# Patient Record
Sex: Female | Born: 1965 | Race: White | Hispanic: No | Marital: Married | Smoking: Never smoker
Health system: Southern US, Community
[De-identification: ages and names within clinical notes are randomized; demographics above are authoritative.]

## PROBLEM LIST (undated history)

## (undated) DIAGNOSIS — D649 Anemia, unspecified: Secondary | ICD-10-CM

## (undated) DIAGNOSIS — R87619 Unspecified abnormal cytological findings in specimens from cervix uteri: Secondary | ICD-10-CM

---

## 2005-05-16 ENCOUNTER — Ambulatory Visit: Payer: Self-pay | Admitting: Gastroenterology

## 2006-06-20 ENCOUNTER — Ambulatory Visit: Payer: Self-pay | Admitting: Obstetrics and Gynecology

## 2008-09-02 ENCOUNTER — Ambulatory Visit: Payer: Self-pay | Admitting: General Practice

## 2009-05-14 ENCOUNTER — Ambulatory Visit: Payer: Self-pay | Admitting: Obstetrics and Gynecology

## 2011-11-29 ENCOUNTER — Ambulatory Visit: Payer: Self-pay | Admitting: Obstetrics and Gynecology

## 2012-12-11 ENCOUNTER — Ambulatory Visit: Payer: Self-pay | Admitting: Obstetrics and Gynecology

## 2015-07-30 ENCOUNTER — Other Ambulatory Visit: Payer: Self-pay | Admitting: Obstetrics and Gynecology

## 2015-07-30 DIAGNOSIS — Z1231 Encounter for screening mammogram for malignant neoplasm of breast: Secondary | ICD-10-CM

## 2015-08-18 ENCOUNTER — Ambulatory Visit: Payer: Self-pay

## 2015-08-19 ENCOUNTER — Ambulatory Visit
Admission: RE | Admit: 2015-08-19 | Discharge: 2015-08-19 | Disposition: A | Discharge status: Home / Self Care | Payer: 59 | Source: Ambulatory Visit | Attending: Obstetrics and Gynecology | Admitting: Obstetrics and Gynecology

## 2015-08-19 ENCOUNTER — Other Ambulatory Visit: Payer: Self-pay | Admitting: Obstetrics and Gynecology

## 2015-08-19 DIAGNOSIS — Z1231 Encounter for screening mammogram for malignant neoplasm of breast: Secondary | ICD-10-CM

## 2015-08-19 DIAGNOSIS — R928 Other abnormal and inconclusive findings on diagnostic imaging of breast: Secondary | ICD-10-CM | POA: Diagnosis not present

## 2015-08-21 ENCOUNTER — Other Ambulatory Visit: Payer: Self-pay | Admitting: Obstetrics and Gynecology

## 2015-08-21 DIAGNOSIS — N6489 Other specified disorders of breast: Secondary | ICD-10-CM

## 2015-08-27 ENCOUNTER — Ambulatory Visit
Admission: RE | Admit: 2015-08-27 | Discharge: 2015-08-27 | Disposition: A | Discharge status: Home / Self Care | Payer: 59 | Source: Ambulatory Visit | Attending: Obstetrics and Gynecology | Admitting: Obstetrics and Gynecology

## 2015-08-27 DIAGNOSIS — N6489 Other specified disorders of breast: Secondary | ICD-10-CM

## 2015-08-27 DIAGNOSIS — N6001 Solitary cyst of right breast: Secondary | ICD-10-CM | POA: Diagnosis not present

## 2016-07-19 DIAGNOSIS — N921 Excessive and frequent menstruation with irregular cycle: Secondary | ICD-10-CM | POA: Diagnosis not present

## 2016-08-02 DIAGNOSIS — J020 Streptococcal pharyngitis: Secondary | ICD-10-CM | POA: Diagnosis not present

## 2016-08-02 DIAGNOSIS — J029 Acute pharyngitis, unspecified: Secondary | ICD-10-CM | POA: Diagnosis not present

## 2016-08-09 DIAGNOSIS — N920 Excessive and frequent menstruation with regular cycle: Secondary | ICD-10-CM | POA: Diagnosis not present

## 2016-08-09 DIAGNOSIS — R938 Abnormal findings on diagnostic imaging of other specified body structures: Secondary | ICD-10-CM | POA: Diagnosis not present

## 2016-08-09 DIAGNOSIS — N921 Excessive and frequent menstruation with irregular cycle: Secondary | ICD-10-CM | POA: Diagnosis not present

## 2016-08-25 DIAGNOSIS — Z01419 Encounter for gynecological examination (general) (routine) without abnormal findings: Secondary | ICD-10-CM | POA: Diagnosis not present

## 2016-08-25 DIAGNOSIS — N920 Excessive and frequent menstruation with regular cycle: Secondary | ICD-10-CM | POA: Diagnosis not present

## 2016-08-25 DIAGNOSIS — Z1231 Encounter for screening mammogram for malignant neoplasm of breast: Secondary | ICD-10-CM | POA: Diagnosis not present

## 2016-09-06 ENCOUNTER — Encounter
Admission: RE | Admit: 2016-09-06 | Discharge: 2016-09-06 | Disposition: A | Discharge status: Home / Self Care | Payer: 59 | Source: Ambulatory Visit | Attending: Obstetrics and Gynecology | Admitting: Obstetrics and Gynecology

## 2016-09-06 HISTORY — DX: Anemia, unspecified: D64.9

## 2016-09-06 HISTORY — DX: Unspecified abnormal cytological findings in specimens from cervix uteri: R87.619

## 2016-09-06 NOTE — H&P (Signed)
Patient ID: Laurie Reynolds is a 51 y.o. female presenting with Annual Exam ([place])  on 08/25/2016  HPI: AUB with a SIS showing calcifide fibroid submucosal and polyp.  _EMBx 08/09/16:  ENDOMETRIUM, BIOPSY:  ATROPHIC ENDOMETRIAL GLANDS AND DECIDUALIZED STROMA COMPATIBLE  WITH PROGESTIN EFFECT. NO HYPERPLASIA OR CARCINOMA.   Pap test:  6/17: neg with neg HPV  Past Medical History:  has a past medical history of Abnormal uterine bleeding, unspecified.  Past Surgical History:  has a past surgical history that includes Cesarean section. Family History: family history includes Thyroid disease in her mother. Social History:  reports that she has never smoked. She has never used smokeless tobacco. She reports that she drinks alcohol. She reports that she does not use drugs. OB/GYN History:          OB History    Gravida Para Term Preterm AB Living   3 3 3     2    SAB TAB Ectopic Molar Multiple Live Births                    Allergies: has No Known Allergies. Medications:  Current Outpatient Prescriptions:  .  metroNIDAZOLE (FLAGYL) 500 MG tablet, Take 1 tablet (500 mg total) by mouth 2 (two) times daily. (Patient not taking: Reported on 08/25/2016 ), Disp: 14 tablet, Rfl: 0 .  norgestimate-ethinyl estradiol (ORTHO-CYCLEN,SPINTEC,PREVIFEM) 0.25-35 mg-mcg tablet, Take 1 tablet by mouth once daily. (Patient not taking: Reported on 08/25/2016 ), Disp: 1 Package, Rfl: 0  Review of Systems: No SOB, no palpitations or chest pain, no new lower extremity edema, no nausea or vomiting or bowel or bladder complaints. See HPI for gyn specific ROS.   Exam:   BP 134/86   Pulse 70   Ht 172.7 cm (5\' 8" )   Wt 69.4 kg (153 lb)   LMP 08/10/2016 (Exact Date)   BMI 23.26 kg/m   General: Patient is well-groomed, well-nourished, appears stated age in no acute distress  HEENT: head is atraumatic and normocephalic, trachea is midline, neck is supple with no  palpable nodules  CV: Regular rhythm and normal heart rate, no murmur  Pulm: Clear to auscultation throughout lung fields with no wheezing, crackles, or rhonchi. No increased work of breathing  Abdomen: soft , no mass, non-tender, no rebound tenderness, no hepatomegaly  Pelvic: tanner stage 5 ,              External genitalia: vulva /labia no lesions             Urethra: no prolapse             Vagina: normal physiologic d/c, laxity in vaginal walls             Cervix: no lesions, no cervical motion tenderness, good descent             Uterus: normal size shape and contour, non-tender             Adnexa: no mass,  non-tender               Rectovaginal: External wnl       Impression:   The primary encounter diagnosis was Encounter for gynecological examination without abnormal finding. Diagnoses of Breast cancer screening and Excessive or frequent menstruation were also pertinent to this visit.    Plan:   Patient returns for a preoperative discussion regarding her plans to proceed with definitive surgical treatment of her AUB-polyp and fibroid by  total vaginal hysterectomy with bilateral salpingectomy. We discussed D&C with myomectomy in detail as well  The patient and I discussed the technical aspects of the procedure including the potential for risks and complications. These include but are not limited to the risk of infection requiring post-operative antibiotics or further procedures. We talked about the risk of injury to adjacent organs including bladder, bowel, ureter, blood vessels or nerves. We talked about the need to convert to a laparoscopy or an open incision. We talked about the possible need for blood transfusion. We talked aboutpostop complications such asthromboembolic or cardiopulmonary complications. All of her questions were answered.  Her preoperative exam was completed and the appropriate consents were signed. She is scheduled to undergo this  procedure in the near future.

## 2016-09-06 NOTE — Patient Instructions (Signed)
  Your procedure is scheduled on: 09-26-16 MONDAY Report to Same Day Surgery 2nd floor medical mall [facility] Entrance-take elevator on left to 2nd floor.  Check in with surgery information desk.) To find out your arrival time please call [phone] between 1PM - 3PM on 09-23-16 FRIDAY  Remember: Instructions that are not followed completely may result in serious medical risk, up to and including death, or upon the discretion of your surgeon and anesthesiologist your surgery may need to be rescheduled.    _x___ 1. Do not eat food or drink liquids after midnight. No gum chewing or hard candies.     __x__ 2. No Alcohol for 24 hours before or after surgery.   __x__3. No Smoking for 24 prior to surgery.   ____  4. Bring all medications with you on the day of surgery if instructed.    __x__ 5. Notify your doctor if there is any change in your medical condition     (cold, fever, infections).     Do not wear jewelry, make-up, hairpins, clips or nail polish.  Do not wear lotions, powders, or perfumes. You may wear deodorant.  Do not shave 48 hours prior to surgery. Men may shave face and neck.  Do not bring valuables to the hospital.    [facility] is not responsible for any belongings or valuables.               Contacts, dentures or bridgework may not be worn into surgery.  Leave your suitcase in the car. After surgery it may be brought to your room.  For patients admitted to the hospital, discharge time is determined by your treatment team.   Patients discharged the day of surgery will not be allowed to drive home.  You will need someone to drive you home and stay with you the night of your procedure.    Please read over the following fact sheets that you were given:      ____ Take anti-hypertensive (unless it includes a diuretic), cardiac, seizure, asthma,     anti-reflux and psychiatric medicines. These include:  1. NONE  2.  3.  4.  5.  6.  ____Fleets enema or Magnesium  Citrate as directed.   ____ Use CHG Soap or sage wipes as directed on instruction sheet   ____ Use inhalers on the day of surgery and bring to hospital day of surgery  ____ Stop Metformin and Janumet 2 days prior to surgery.    ____ Take 1/2 of usual insulin dose the night before surgery and none on the morning surgery.   ____ Follow recommendations from Cardiologist, Pulmonologist or PCP regarding stopping Aspirin, Coumadin, Pllavix ,Eliquis, Effient, or Pradaxa, and Pletal.  X____Stop Anti-inflammatories such as Advil, Aleve, IBUPROFEN, Motrin, Naproxen, Naprosyn, Goodies powders or aspirin products 7 DAYS PRIOR TO SURGERY-OK to take Tylenol    _x___ Stop supplements until after surgery-STOP HERBAL LIFE VITAMINS 7 DAYS PRIOR TO SURGERY-MAY RESUME AFTER SURGERY   ____ Bring C-Pap to the hospital.

## 2016-09-19 ENCOUNTER — Encounter
Admission: RE | Admit: 2016-09-19 | Discharge: 2016-09-19 | Disposition: A | Discharge status: Home / Self Care | Payer: 59 | Source: Ambulatory Visit | Attending: Obstetrics and Gynecology | Admitting: Obstetrics and Gynecology

## 2016-09-19 DIAGNOSIS — Z9889 Other specified postprocedural states: Secondary | ICD-10-CM | POA: Insufficient documentation

## 2016-09-19 DIAGNOSIS — N920 Excessive and frequent menstruation with regular cycle: Secondary | ICD-10-CM | POA: Diagnosis not present

## 2016-09-19 DIAGNOSIS — Z01812 Encounter for preprocedural laboratory examination: Secondary | ICD-10-CM | POA: Insufficient documentation

## 2016-09-19 DIAGNOSIS — D259 Leiomyoma of uterus, unspecified: Secondary | ICD-10-CM | POA: Diagnosis not present

## 2016-09-19 DIAGNOSIS — Z79899 Other long term (current) drug therapy: Secondary | ICD-10-CM | POA: Insufficient documentation

## 2016-09-19 DIAGNOSIS — Z01810 Encounter for preprocedural cardiovascular examination: Secondary | ICD-10-CM | POA: Insufficient documentation

## 2016-09-19 DIAGNOSIS — Z8349 Family history of other endocrine, nutritional and metabolic diseases: Secondary | ICD-10-CM | POA: Insufficient documentation

## 2016-09-19 LAB — CBC
HCT: 34 % — ABNORMAL LOW (ref 35.0–47.0)
Hemoglobin: 11.1 g/dL — ABNORMAL LOW (ref 12.0–16.0)
MCH: 27.5 pg (ref 26.0–34.0)
MCHC: 32.7 g/dL (ref 32.0–36.0)
MCV: 84 fL (ref 80.0–100.0)
PLATELETS: 385 10*3/uL (ref 150–440)
RBC: 4.05 MIL/uL (ref 3.80–5.20)
RDW: 18.2 % — AB (ref 11.5–14.5)
WBC: 6.9 10*3/uL (ref 3.6–11.0)

## 2016-09-19 LAB — BASIC METABOLIC PANEL
Anion gap: 6 (ref 5–15)
BUN: 9 mg/dL (ref 6–20)
CALCIUM: 9.1 mg/dL (ref 8.9–10.3)
CO2: 27 mmol/L (ref 22–32)
CREATININE: 0.78 mg/dL (ref 0.44–1.00)
Chloride: 105 mmol/L (ref 101–111)
GFR calc non Af Amer: >60 mL/min (ref >60)
Glucose, Bld: 88 mg/dL (ref 65–99)
Potassium: 3.9 mmol/L (ref 3.5–5.1)
Sodium: 138 mmol/L (ref 135–145)

## 2016-09-19 LAB — TYPE AND SCREEN
ABO/RH(D): O NEG
Antibody Screen: NEGATIVE

## 2016-09-25 MED ORDER — CEFAZOLIN SODIUM-DEXTROSE 2-4 GM/100ML-% IV SOLN
2.0000 g | INTRAVENOUS | Status: AC
  Administered 2016-09-26: 2 g via INTRAVENOUS

## 2016-09-26 ENCOUNTER — Ambulatory Visit: Payer: 59 | Admitting: Anesthesiology

## 2016-09-26 ENCOUNTER — Ambulatory Visit
Admission: RE | Admit: 2016-09-26 | Discharge: 2016-09-26 | Disposition: A | Discharge status: Home / Self Care | Payer: 59 | Source: Ambulatory Visit | Attending: Obstetrics and Gynecology | Admitting: Obstetrics and Gynecology

## 2016-09-26 ENCOUNTER — Encounter
Admission: RE | Disposition: A | Discharge status: Home / Self Care | Payer: Self-pay | Source: Ambulatory Visit | Attending: Obstetrics and Gynecology

## 2016-09-26 DIAGNOSIS — D250 Submucous leiomyoma of uterus: Secondary | ICD-10-CM | POA: Diagnosis present

## 2016-09-26 DIAGNOSIS — N888 Other specified noninflammatory disorders of cervix uteri: Secondary | ICD-10-CM | POA: Diagnosis not present

## 2016-09-26 DIAGNOSIS — N920 Excessive and frequent menstruation with regular cycle: Secondary | ICD-10-CM | POA: Diagnosis not present

## 2016-09-26 DIAGNOSIS — N838 Other noninflammatory disorders of ovary, fallopian tube and broad ligament: Secondary | ICD-10-CM | POA: Diagnosis not present

## 2016-09-26 DIAGNOSIS — Z79899 Other long term (current) drug therapy: Secondary | ICD-10-CM | POA: Diagnosis not present

## 2016-09-26 DIAGNOSIS — N921 Excessive and frequent menstruation with irregular cycle: Secondary | ICD-10-CM | POA: Diagnosis not present

## 2016-09-26 HISTORY — PX: VAGINAL HYSTERECTOMY: SHX2639

## 2016-09-26 HISTORY — PX: BILATERAL SALPINGECTOMY: SHX5743

## 2016-09-26 LAB — POCT PREGNANCY, URINE: Preg Test, Ur: NEGATIVE

## 2016-09-26 LAB — ABO/RH: ABO/RH(D): O NEG

## 2016-09-26 SURGERY — HYSTERECTOMY, VAGINAL
Anesthesia: General | Wound class: Clean Contaminated

## 2016-09-26 MED ORDER — ACETAMINOPHEN 10 MG/ML IV SOLN
INTRAVENOUS | Status: DC | PRN
  Administered 2016-09-26: 1000 mg via INTRAVENOUS

## 2016-09-26 MED ORDER — ACETAMINOPHEN 500 MG PO TABS: 1000.0000 mg | ORAL_TABLET | Freq: Four times a day (QID) | ORAL | 0 refills | Status: AC

## 2016-09-26 MED ORDER — ROCURONIUM BROMIDE 100 MG/10ML IV SOLN
INTRAVENOUS | Status: DC | PRN
  Administered 2016-09-26: 20 mg via INTRAVENOUS
  Administered 2016-09-26: 10 mg via INTRAVENOUS
  Administered 2016-09-26: 30 mg via INTRAVENOUS

## 2016-09-26 MED ORDER — FAMOTIDINE 20 MG PO TABS
ORAL_TABLET | ORAL | Status: AC
  Filled 2016-09-26: qty 1

## 2016-09-26 MED ORDER — LIDOCAINE-EPINEPHRINE 1 %-1:100000 IJ SOLN
INTRAMUSCULAR | Status: DC | PRN
  Administered 2016-09-26: 20 mL

## 2016-09-26 MED ORDER — SUGAMMADEX SODIUM 200 MG/2ML IV SOLN
INTRAVENOUS | Status: DC | PRN
  Administered 2016-09-26: 140 mg via INTRAVENOUS

## 2016-09-26 MED ORDER — FAMOTIDINE 20 MG PO TABS
20.0000 mg | ORAL_TABLET | Freq: Once | ORAL | Status: AC
  Administered 2016-09-26: 20 mg via ORAL

## 2016-09-26 MED ORDER — OXYCODONE HCL 5 MG PO CAPS: 5.0000 mg | ORAL_CAPSULE | Freq: Four times a day (QID) | ORAL | 0 refills | Status: AC | PRN

## 2016-09-26 MED ORDER — OXYCODONE HCL 5 MG PO TABS
ORAL_TABLET | ORAL | Status: AC
  Administered 2016-09-26: 5 mg via ORAL
  Filled 2016-09-26: qty 1

## 2016-09-26 MED ORDER — LIDOCAINE HCL (CARDIAC) 20 MG/ML IV SOLN
INTRAVENOUS | Status: DC | PRN
  Administered 2016-09-26: 50 mg via INTRAVENOUS

## 2016-09-26 MED ORDER — ACETAMINOPHEN 10 MG/ML IV SOLN
INTRAVENOUS | Status: AC
Start: 2016-09-26 — End: ?
  Filled 2016-09-26: qty 100

## 2016-09-26 MED ORDER — LACTATED RINGERS IV SOLN: INTRAVENOUS | Status: DC

## 2016-09-26 MED ORDER — LACTATED RINGERS IV SOLN
INTRAVENOUS | Status: DC
  Administered 2016-09-26: 12:00:00 via INTRAVENOUS

## 2016-09-26 MED ORDER — SUGAMMADEX SODIUM 500 MG/5ML IV SOLN
INTRAVENOUS | Status: AC
  Filled 2016-09-26: qty 5

## 2016-09-26 MED ORDER — FENTANYL CITRATE (PF) 100 MCG/2ML IJ SOLN
INTRAMUSCULAR | Status: DC | PRN
  Administered 2016-09-26: 50 ug via INTRAVENOUS
  Administered 2016-09-26: 150 ug via INTRAVENOUS

## 2016-09-26 MED ORDER — CEFAZOLIN SODIUM-DEXTROSE 2-4 GM/100ML-% IV SOLN
INTRAVENOUS | Status: AC
  Filled 2016-09-26: qty 100

## 2016-09-26 MED ORDER — MIDAZOLAM HCL 2 MG/2ML IJ SOLN
INTRAMUSCULAR | Status: AC
  Filled 2016-09-26: qty 2

## 2016-09-26 MED ORDER — IBUPROFEN 600 MG PO TABS
ORAL_TABLET | ORAL | Status: AC
  Filled 2016-09-26: qty 1

## 2016-09-26 MED ORDER — FENTANYL CITRATE (PF) 100 MCG/2ML IJ SOLN
INTRAMUSCULAR | Status: AC
  Administered 2016-09-26: 25 ug via INTRAVENOUS
  Filled 2016-09-26: qty 2

## 2016-09-26 MED ORDER — OXYCODONE HCL 5 MG PO TABS
5.0000 mg | ORAL_TABLET | Freq: Once | ORAL | Status: AC | PRN
  Administered 2016-09-26: 5 mg via ORAL

## 2016-09-26 MED ORDER — DEXAMETHASONE SODIUM PHOSPHATE 10 MG/ML IJ SOLN
INTRAMUSCULAR | Status: AC
  Filled 2016-09-26: qty 1

## 2016-09-26 MED ORDER — DOCUSATE SODIUM 100 MG PO CAPS: 100.0000 mg | ORAL_CAPSULE | Freq: Two times a day (BID) | ORAL | 0 refills | Status: AC

## 2016-09-26 MED ORDER — FENTANYL CITRATE (PF) 250 MCG/5ML IJ SOLN
INTRAMUSCULAR | Status: AC
  Filled 2016-09-26: qty 5

## 2016-09-26 MED ORDER — MIDAZOLAM HCL 2 MG/2ML IJ SOLN
INTRAMUSCULAR | Status: DC | PRN
  Administered 2016-09-26: 2 mg via INTRAVENOUS

## 2016-09-26 MED ORDER — DEXAMETHASONE SODIUM PHOSPHATE 10 MG/ML IJ SOLN
INTRAMUSCULAR | Status: DC | PRN
  Administered 2016-09-26: 10 mg via INTRAVENOUS

## 2016-09-26 MED ORDER — KETOROLAC TROMETHAMINE 30 MG/ML IJ SOLN
INTRAMUSCULAR | Status: DC | PRN
  Administered 2016-09-26: 30 mg via INTRAVENOUS

## 2016-09-26 MED ORDER — PROPOFOL 10 MG/ML IV BOLUS
INTRAVENOUS | Status: DC | PRN
  Administered 2016-09-26: 130 mg via INTRAVENOUS

## 2016-09-26 MED ORDER — ONDANSETRON HCL 4 MG/2ML IJ SOLN
INTRAMUSCULAR | Status: AC
  Filled 2016-09-26: qty 2

## 2016-09-26 MED ORDER — ONDANSETRON HCL 4 MG/2ML IJ SOLN
INTRAMUSCULAR | Status: DC | PRN
  Administered 2016-09-26: 4 mg via INTRAVENOUS

## 2016-09-26 MED ORDER — ROCURONIUM BROMIDE 50 MG/5ML IV SOLN
INTRAVENOUS | Status: AC
  Filled 2016-09-26: qty 1

## 2016-09-26 MED ORDER — FENTANYL CITRATE (PF) 100 MCG/2ML IJ SOLN
25.0000 ug | INTRAMUSCULAR | Status: DC | PRN
  Administered 2016-09-26 (×4): 25 ug via INTRAVENOUS

## 2016-09-26 MED ORDER — IBUPROFEN 600 MG PO TABS
600.0000 mg | ORAL_TABLET | Freq: Four times a day (QID) | ORAL | Status: DC | PRN
  Administered 2016-09-26: 600 mg via ORAL

## 2016-09-26 MED ORDER — OXYCODONE HCL 5 MG/5ML PO SOLN: 5.0000 mg | Freq: Once | ORAL | Status: AC | PRN

## 2016-09-26 MED ORDER — LIDOCAINE-EPINEPHRINE 1 %-1:100000 IJ SOLN
INTRAMUSCULAR | Status: AC
  Filled 2016-09-26: qty 1

## 2016-09-26 MED ORDER — GABAPENTIN 800 MG PO TABS: 800.0000 mg | ORAL_TABLET | Freq: Every day | ORAL | 0 refills | Status: AC

## 2016-09-26 MED ORDER — IBUPROFEN 800 MG PO TABS: 800.0000 mg | ORAL_TABLET | Freq: Three times a day (TID) | ORAL | 1 refills | Status: AC | PRN

## 2016-09-26 MED ORDER — PROPOFOL 10 MG/ML IV BOLUS
INTRAVENOUS | Status: AC
  Filled 2016-09-26: qty 20

## 2016-09-26 MED ORDER — KETOROLAC TROMETHAMINE 30 MG/ML IJ SOLN
INTRAMUSCULAR | Status: AC
  Filled 2016-09-26: qty 1

## 2016-09-26 SURGICAL SUPPLY — 31 items
BAG URO DRAIN 2000ML W/SPOUT (MISCELLANEOUS) ×3 IMPLANT
CANISTER SUCT 1200ML W/VALVE (MISCELLANEOUS) ×3 IMPLANT
CATH FOLEY 2WAY  5CC 16FR (CATHETERS) ×1
CATH URTH 16FR FL 2W BLN LF (CATHETERS) ×2 IMPLANT
DRAPE PERI LITHO V/GYN (MISCELLANEOUS) ×3 IMPLANT
DRAPE SURG 17X11 SM STRL (DRAPES) ×3 IMPLANT
DRAPE UNDER BUTTOCK W/FLU (DRAPES) ×3 IMPLANT
ELECT REM PT RETURN 9FT ADLT (ELECTROSURGICAL) ×3
ELECTRODE REM PT RTRN 9FT ADLT (ELECTROSURGICAL) ×2 IMPLANT
GLOVE BIO SURGEON STRL SZ7 (GLOVE) ×3 IMPLANT
GLOVE INDICATOR 7.5 STRL GRN (GLOVE) ×3 IMPLANT
GOWN STRL REUS W/ TWL LRG LVL3 (GOWN DISPOSABLE) ×6 IMPLANT
GOWN STRL REUS W/ TWL XL LVL3 (GOWN DISPOSABLE) ×2 IMPLANT
GOWN STRL REUS W/TWL LRG LVL3 (GOWN DISPOSABLE) ×3
GOWN STRL REUS W/TWL XL LVL3 (GOWN DISPOSABLE) ×1
KIT RM TURNOVER CYSTO AR (KITS) ×3 IMPLANT
LABEL OR SOLS (LABEL) ×3 IMPLANT
NDL SAFETY 22GX1.5 (NEEDLE) ×3 IMPLANT
PACK BASIN MINOR ARMC (MISCELLANEOUS) ×3 IMPLANT
PAD OB MATERNITY 4.3X12.25 (PERSONAL CARE ITEMS) ×3 IMPLANT
PAD PREP 24X41 OB/GYN DISP (PERSONAL CARE ITEMS) ×3 IMPLANT
SUT PDS 2-0 27IN (SUTURE) ×3 IMPLANT
SUT PDS AB 2-0 CT1 27 (SUTURE) ×3 IMPLANT
SUT VIC AB 0 CT1 27 (SUTURE) ×3
SUT VIC AB 0 CT1 27XCR 8 STRN (SUTURE) ×6 IMPLANT
SUT VIC AB 0 CT1 36 (SUTURE) ×6 IMPLANT
SUT VIC AB 2-0 SH 27 (SUTURE) ×3
SUT VIC AB 2-0 SH 27XBRD (SUTURE) ×6 IMPLANT
SYR CONTROL 10ML (SYRINGE) ×3 IMPLANT
SYRINGE 10CC LL (SYRINGE) ×3 IMPLANT
WATER STERILE IRR 1000ML POUR (IV SOLUTION) ×3 IMPLANT

## 2016-09-26 NOTE — Discharge Instructions (Addendum)
Discharge instructions after  vaginal hysterectomy  Signs and Symptoms to Report Call our office at [phone] if you have any of the following.   Fever over 100.4 degrees or higher  Severe stomach pain not relieved with pain medications  Bright red bleeding thats heavier than a period that does not slow with rest  To go the bathroom a lot (frequency), you cant hold your urine (urgency), or it hurts when you empty your bladder (urinate)  Chest pain  Shortness of breath  Pain in the calves of your legs  Severe nausea and vomiting not relieved with anti-nausea medications  Signs of infection around your wounds, such as redness, hot to touch, swelling, green/yellow drainage (like pus), bad smelling discharge  Any concerns  What You Can Expect after Surgery  You may see some pink tinged, bloody fluid and bruising around the wound. This is normal.  You may have a sore throat because of the tube in your mouth during general anesthesia. This will go away in 2 to 3 days.  You may have some stomach cramps.  You may notice spotting on your panties.   Activities after Your Discharge Follow these guidelines to help speed your recovery at home:  Do the coughing and deep breathing as you did in the hospital for 2 weeks. Use the small blue breathing device, called the incentive spirometer for 2 weeks.  Dont drive if you are in pain or taking narcotic pain medicine. You may drive when you can safely slam on the brakes, turn the wheel forcefully, and rotate your torso comfortably. This is typically 1-2 weeks. Practice in a parking lot or side street prior to attempting to drive regularly.   Ask others to help with household chores for 4 weeks.  Do not lift anything heavier that 10 pounds for 4-6 weeks. This includes pets, children, and groceries.  Dont do strenuous activities, exercises, or sports like vacuuming, tennis, squash, etc. until your doctor says it is safe to do  so. ---Maintain pelvic rest for 8 weeks. This means nothing in the vagina or rectum at all (no douching, tampons, intercourse) for 8 weeks.   Walk as you feel able. Rest often since it may take two or three weeks for your energy level to return to normal.   You may climb stairs  Avoid constipation:   -Eat fruits, vegetables, and whole grains. Eat small meals as your appetite will take time to return to normal.   -Drink 6 to 8 glasses of water each day unless your doctor has told you to limit your fluids.   -Use a laxative or stool softener as needed if constipation becomes a problem. You may take Miralax, metamucil, Citrucil, Colace, Senekot, FiberCon, etc. If this does not relieve the constipation, try two tablespoons of Milk Of Magnesia every 8 hours until your bowels move.   You may shower. Gently wash the wounds with a mild soap and water. Pat dry.  Do not get in a hot tub, swimming pool, etc. for 6 weeks.  Do not use lotions, oils, powders on the wounds.  Do not douche, use tampons, or have sex until your doctor says it is okay.  Take your pain medicine when you need it. The medicine may not work as well if the pain is bad.  Take the medicines you were taking before surgery. Other medications you will need are pain medications (Norco or Percocet) and nausea medications (Zofran). AMBULATORY SURGERY  DISCHARGE INSTRUCTIONS   1)  The drugs that you were given will stay in your system until tomorrow so for the next 24 hours you should not:  A) Drive an automobile B) Make any legal decisions C) Drink any alcoholic beverage   2) You may resume regular meals tomorrow.  Today it is better to start with liquids and gradually work up to solid foods.  You may eat anything you prefer, but it is better to start with liquids, then soup and crackers, and gradually work up to solid foods.   3) Please notify your doctor immediately if you have any unusual bleeding, trouble breathing, redness  and pain at the surgery site, drainage, fever, or pain not relieved by medication.    4) Additional Instructions:        Please contact your physician with any problems or Same Day Surgery at [phone], Monday through Friday 6 am to 4 pm, or [place] at [facility] number at [phone].

## 2016-09-26 NOTE — Transfer of Care (Signed)
Immediate Anesthesia Transfer of Care Note  Patient: Laurie Reynolds  Procedure(s) Performed: Procedure(s): HYSTERECTOMY VAGINAL (N/A) BILATERAL SALPINGECTOMY (Bilateral)  Patient Location: PACU  Anesthesia Type:General  Level of Consciousness: drowsy and patient cooperative  Airway & Oxygen Therapy: Patient Spontanous Breathing and Patient connected to face mask oxygen  Post-op Assessment: Report given to RN and Post -op Vital signs reviewed and stable  Post vital signs: Reviewed and stable  Last Vitals:  Vitals:   09/26/16 1147 09/26/16 1526  BP: 131/82 125/87  Pulse: 74 72  Resp: 16 11  Temp: 36.6 C 36.5 C  SpO2: 100% 100%    Last Pain:  Vitals:   09/26/16 1147  TempSrc: Tympanic      Patients Stated Pain Goal: 2 (48/47/20 7218)  Complications: No apparent anesthesia complications

## 2016-09-26 NOTE — Interval H&P Note (Signed)
History and Physical Interval Note:  09/26/2016 1:05 PM  Laurie Reynolds  has presented today for surgery, with the diagnosis of AUB  Fibroid  The various methods of treatment have been discussed with the patient and family. After consideration of risks, benefits and other options for treatment, the patient has consented to  Procedure(s): HYSTERECTOMY VAGINAL (N/A) BILATERAL SALPINGECTOMY (Bilateral) as a surgical intervention .  The patient's history has been reviewed, patient examined, no change in status, stable for surgery.  I have reviewed the patient's chart and labs.  Questions were answered to the patient's satisfaction.     Benjaman Kindler

## 2016-09-26 NOTE — Anesthesia Post-op Follow-up Note (Signed)
Anesthesia QCDR form completed.        

## 2016-09-26 NOTE — Anesthesia Preprocedure Evaluation (Signed)
Anesthesia Evaluation  Patient identified by MRN, date of birth, ID band Patient awake    Reviewed: Allergy & Precautions, H&P , NPO status , Patient's Chart, lab work & pertinent test results  History of Anesthesia Complications Negative for: history of anesthetic complications  Airway Mallampati: III  TM Distance: <3 FB Neck ROM: full    Dental  (+) Chipped   Pulmonary neg pulmonary ROS, neg shortness of breath,           Cardiovascular Exercise Tolerance: Good (-) angina(-) Past MI and (-) DOE negative cardio ROS       Neuro/Psych negative neurological ROS  negative psych ROS   GI/Hepatic negative GI ROS, Neg liver ROS, neg GERD  ,  Endo/Other  negative endocrine ROS  Renal/GU      Musculoskeletal   Abdominal   Peds  Hematology   Anesthesia Other Findings Past Medical History: No date: Abnormal Pap smear of cervix     Comment:  AGE 51 No date: Anemia  Past Surgical History: 1990: CESAREAN SECTION     Reproductive/Obstetrics negative OB ROS                             Anesthesia Physical Anesthesia Plan  ASA: II  Anesthesia Plan: General ETT   Post-op Pain Management:    Induction: Intravenous  PONV Risk Score and Plan:   Airway Management Planned: Oral ETT  Additional Equipment:   Intra-op Plan:   Post-operative Plan: Extubation in OR  Informed Consent: I have reviewed the patients History and Physical, chart, labs and discussed the procedure including the risks, benefits and alternatives for the proposed anesthesia with the patient or authorized representative who has indicated his/her understanding and acceptance.   Dental Advisory Given  Plan Discussed with: Anesthesiologist, CRNA and Surgeon  Anesthesia Plan Comments: (Patient consented for risks of anesthesia including but not limited to:  - adverse reactions to medications - damage to teeth, lips or  other oral mucosa - sore throat or hoarseness - Damage to heart, brain, lungs or loss of life  Patient voiced understanding.)        Anesthesia Quick Evaluation

## 2016-09-26 NOTE — Op Note (Signed)
Laurie Reynolds PROCEDURE DATE: 09/26/2016  PREOPERATIVE DIAGNOSIS:   AUB- fibroids and polyps POSTOPERATIVE DIAGNOSIS:   Same SURGEON:   Benjaman Kindler, M.D. ASSISTANT: Boykin Nearing, M.D. OPERATION:  Total Vaginal Hysterectomy, bilateral salpingectomy ANESTHESIA:  General endotracheal. Anesthesiologist: Anesthesiologist: Martha Clan, MD; Piscitello, Precious Haws, MD CRNA: Hedda Slade, CRNA; Jonna Clark, CRNA  INDICATIONS: The patient is a 51 y.o. para 3 with history of AUB and known intrauterine lesions. The patient made a decision to undergo definite surgical treatment. On the preoperative visit, the risks, benefits, indications, and alternatives of the procedure were reviewed with the patient.  On the day of surgery, the risks of surgery were again discussed with the patient including but not limited to: bleeding which may require transfusion or reoperation; infection which may require antibiotics; injury to bowel, bladder, ureters or other surrounding organs; need for additional procedures; thromboembolic phenomenon, incisional problems and other postoperative/anesthesia complications. Written informed consent was obtained.    OPERATIVE FINDINGS: A 8 week size uterus, retroverted with normal tubes and ovaries bilaterally.  ESTIMATED BLOOD LOSS: 100 ml FLUIDS:  600 ml of Lactated Ringers URINE OUTPUT:  100 ml of clear yellow urine. SPECIMENS:  Uterus and cervix and fallopian tubes sent to pathology COMPLICATIONS:  None immediate.  DESCRIPTION OF PROCEDURE:  The patient received prophylactic intravenous antibiotics and had sequential compression devices applied to her lower extremities while in the preoperative area.    She was taken to the operating room, where she was identified by name and birth date. General anesthesia was administered and was found to be adequate.  She was placed in the dorsal lithotomy position, and was prepped and draped in a sterile manner.  A  formal time out procedure was performed with all team members present and in agreement. A Foley catheter was inserted into her bladder and attached to gravity drainage. Attention was turned to her pelvis. Of note, all sutures used in this case were 0 Vicryl unless otherwise noted.     A weighted speculum was placed in the vagina, and the anterior and posterior lips of the cervix were grasped bilaterally with thyroid tenaculums.  The cervix was then injected circumferentially with 0.25% Marcaine with epinephrine solution to maintain hemostasis.  The cervix was circumferentially incised using electrocautery, and the posterior cul-de-sac was entered sharply in the midline.   A long weighted speculum was inserted into the posterior cul-de-sac. The bladder was dissected off the pubocervical fascia anteriorly with sharp and careful blunt dissection without complication.  The anterior cul-de-sac was then entered sharply without difficulty and the bladder retracted out of the operative field behind a retractor. The Heaney clamp was then used to clamp the uterosacral ligaments on either side.  They were then cut and sutured ligated with 0 Vicryl, and the ligated uterosacral ligaments were transfixed to the ipsilateral vaginal epithelium to further support the vagina and provide hemostasis. The cardinal ligaments were then clamped, cut and ligated. The uterine vessels and broad ligaments were then serially clamped with the Heaney clamps, cut, and suture ligated on both sides.  Excellent hemostasis was noted at this point.    The uterus was then delivered via the posterior cul-de-sac, and the cornua were clamped with the Heaney clamps, transected, and the uterine specimen was delivered and sent to pathology. These pedicles were then suture ligated to ensure hemostasis.   BILATERAL SALPINGECTOMY PARAGRAPH The Fallopian tubes were then identified and individually grasped with Babcock clamps. They were clamped across  entirely with  a Heaney clamp and free-tied, followed by suture ligation for excellent hemostasis bilaterally. The ovaries were left intact and in place.  After completion of the hysterectomy, all pedicles from the uterosacral ligament to the cornua were examined hemostasis was confirmed.  The peritoneum was closed in a purse string fashion with 2-0 PDS, taking care not to incorporate any intraabdominal organs in the closure.The vaginal cuff was then closed with in a running locked fashion with care given to incorporate the uterosacral pedicles bilaterally, which were also tied in the midline.  All instruments were then removed from the pelvis.  The patient tolerated the procedure well.  All instruments, needles, and sponge counts were correct x 2. The patient was taken to the recovery room in stable condition.  She would like to go home today.  Angelina Pih, MD, MPH

## 2016-09-26 NOTE — Progress Notes (Signed)
Patient presented with complaints of severe cramping. The previous nurse administered pain medication. This nurse will continue to monitor the patient post medication administration.

## 2016-09-26 NOTE — OR Nursing (Signed)
Pt discharged via wheelchair to car at medical mall front door.

## 2016-09-26 NOTE — Anesthesia Procedure Notes (Signed)
Procedure Name: Intubation Date/Time: 09/26/2016 1:36 PM Performed by: Jonna Clark Pre-anesthesia Checklist: Patient identified, Patient being monitored, Timeout performed, Emergency Drugs available and Suction available Patient Re-evaluated:Patient Re-evaluated prior to induction Oxygen Delivery Method: Circle system utilized Preoxygenation: Pre-oxygenation with 100% oxygen Induction Type: IV induction Ventilation: Mask ventilation without difficulty Laryngoscope Size: Miller and 2 Grade View: Grade I Tube type: Oral Tube size: 7.0 mm Number of attempts: 1 Airway Equipment and Method: Stylet Placement Confirmation: ETT inserted through vocal cords under direct vision,  positive ETCO2 and breath sounds checked- equal and bilateral Secured at: 21 cm Tube secured with: Tape Dental Injury: Teeth and Oropharynx as per pre-operative assessment

## 2016-09-26 NOTE — OR Nursing (Signed)
Dr Leafy Ro by to see pt. Pt states she wants to go home. Continues with pain. Dr Leafy Ro ordered 600mg  ibuprophen.

## 2016-09-27 ENCOUNTER — Encounter: Payer: Self-pay | Admitting: Obstetrics and Gynecology

## 2016-09-27 NOTE — Anesthesia Postprocedure Evaluation (Signed)
Anesthesia Post Note  Patient: Laurie Reynolds  Procedure(s) Performed: Procedure(s) (LRB): HYSTERECTOMY VAGINAL (N/A) BILATERAL SALPINGECTOMY (Bilateral)  Patient location during evaluation: PACU Anesthesia Type: General Level of consciousness: awake and alert Pain management: pain level controlled Vital Signs Assessment: post-procedure vital signs reviewed and stable Respiratory status: spontaneous breathing, nonlabored ventilation, respiratory function stable and patient connected to nasal cannula oxygen Cardiovascular status: blood pressure returned to baseline and stable Postop Assessment: no signs of nausea or vomiting Anesthetic complications: no     Last Vitals:  Vitals:   09/26/16 1700 09/26/16 1745  BP: 136/86 136/81  Pulse: 60 68  Resp: 14 16  Temp:    SpO2: 100% 100%    Last Pain:  Vitals:   09/26/16 1745  TempSrc:   PainSc: 8                  Martha Clan

## 2016-09-28 LAB — SURGICAL PATHOLOGY

## 2016-12-01 DIAGNOSIS — Z862 Personal history of diseases of the blood and blood-forming organs and certain disorders involving the immune mechanism: Secondary | ICD-10-CM | POA: Diagnosis not present

## 2016-12-01 DIAGNOSIS — L659 Nonscarring hair loss, unspecified: Secondary | ICD-10-CM | POA: Diagnosis not present

## 2016-12-09 ENCOUNTER — Other Ambulatory Visit: Payer: Self-pay | Admitting: Obstetrics and Gynecology

## 2016-12-09 DIAGNOSIS — Z1231 Encounter for screening mammogram for malignant neoplasm of breast: Secondary | ICD-10-CM

## 2017-01-03 ENCOUNTER — Ambulatory Visit
Admission: RE | Admit: 2017-01-03 | Discharge: 2017-01-03 | Disposition: A | Discharge status: Home / Self Care | Payer: 59 | Source: Ambulatory Visit | Attending: Obstetrics and Gynecology | Admitting: Obstetrics and Gynecology

## 2017-01-03 DIAGNOSIS — Z1231 Encounter for screening mammogram for malignant neoplasm of breast: Secondary | ICD-10-CM | POA: Diagnosis not present

## 2017-01-13 DIAGNOSIS — Z0000 Encounter for general adult medical examination without abnormal findings: Secondary | ICD-10-CM | POA: Diagnosis not present

## 2017-01-17 DIAGNOSIS — Z0000 Encounter for general adult medical examination without abnormal findings: Secondary | ICD-10-CM | POA: Diagnosis not present

## 2017-02-28 DIAGNOSIS — R7989 Other specified abnormal findings of blood chemistry: Secondary | ICD-10-CM | POA: Diagnosis not present

## 2017-04-13 DIAGNOSIS — Z800 Family history of malignant neoplasm of digestive organs: Secondary | ICD-10-CM | POA: Diagnosis not present

## 2017-04-13 DIAGNOSIS — Z01818 Encounter for other preprocedural examination: Secondary | ICD-10-CM | POA: Diagnosis not present

## 2017-04-13 DIAGNOSIS — Z1211 Encounter for screening for malignant neoplasm of colon: Secondary | ICD-10-CM | POA: Diagnosis not present

## 2017-05-12 DIAGNOSIS — K640 First degree hemorrhoids: Secondary | ICD-10-CM | POA: Diagnosis not present

## 2017-05-12 DIAGNOSIS — Z1211 Encounter for screening for malignant neoplasm of colon: Secondary | ICD-10-CM | POA: Diagnosis not present

## 2017-05-12 DIAGNOSIS — K648 Other hemorrhoids: Secondary | ICD-10-CM | POA: Diagnosis not present

## 2017-05-12 DIAGNOSIS — Z800 Family history of malignant neoplasm of digestive organs: Secondary | ICD-10-CM | POA: Diagnosis not present

## 2017-05-12 DIAGNOSIS — K5730 Diverticulosis of large intestine without perforation or abscess without bleeding: Secondary | ICD-10-CM | POA: Diagnosis not present

## 2017-07-28 DIAGNOSIS — M19072 Primary osteoarthritis, left ankle and foot: Secondary | ICD-10-CM | POA: Diagnosis not present

## 2017-09-02 IMAGING — MG MM DIGITAL SCREENING BILAT W/ TOMO W/ CAD
9 of 12 series · 9 of 28 positions shown · non-contrast
Comparison: Previous exam(s).

CLINICAL DATA: Screening.

EXAM:
2D DIGITAL SCREENING BILATERAL MAMMOGRAM WITH CAD AND ADJUNCT TOMO

[R MLO synth-2D]
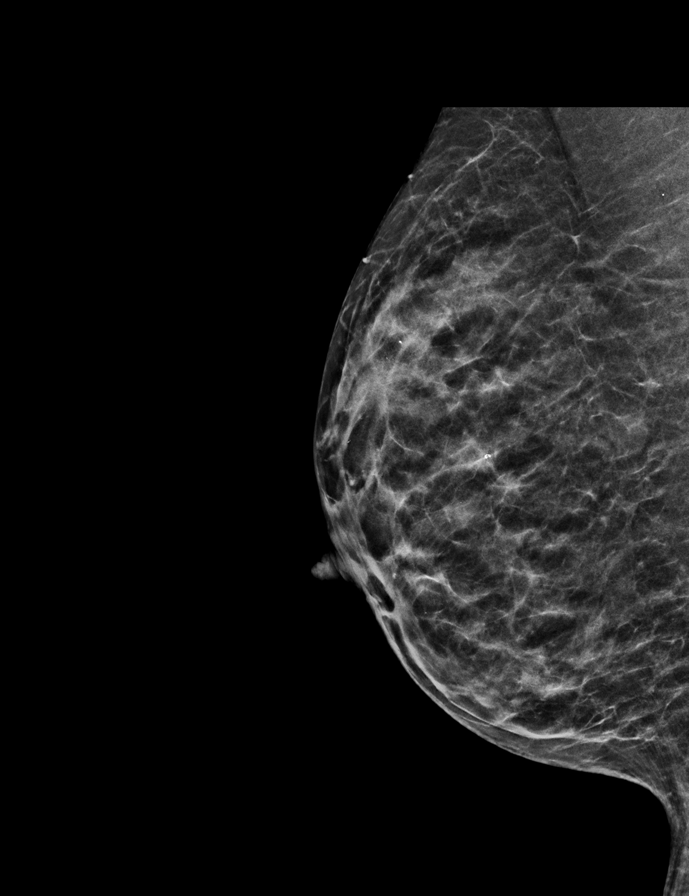

[L CC]
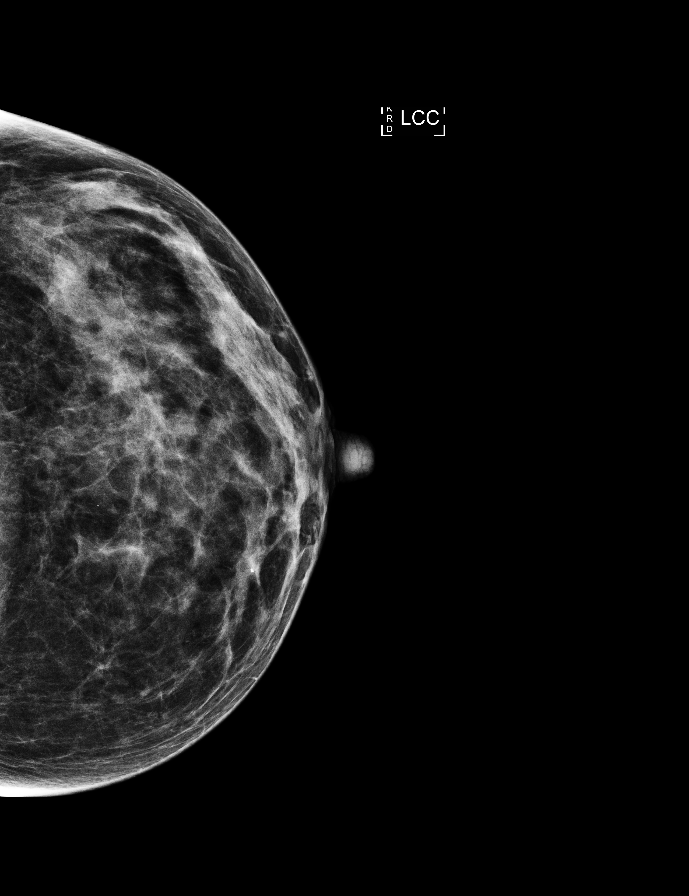

[L MLO synth-2D]
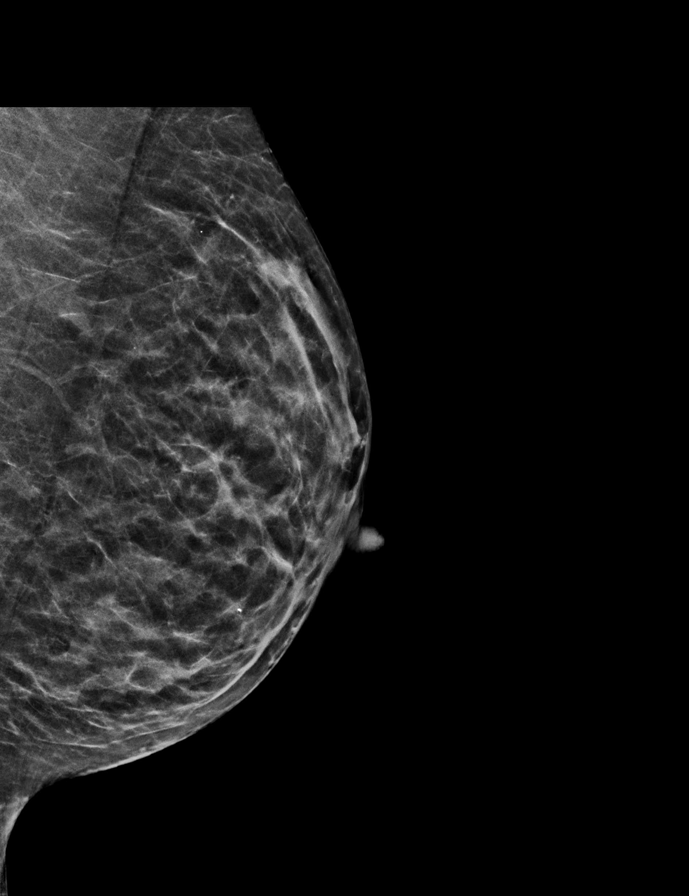

[R CC synth-2D]
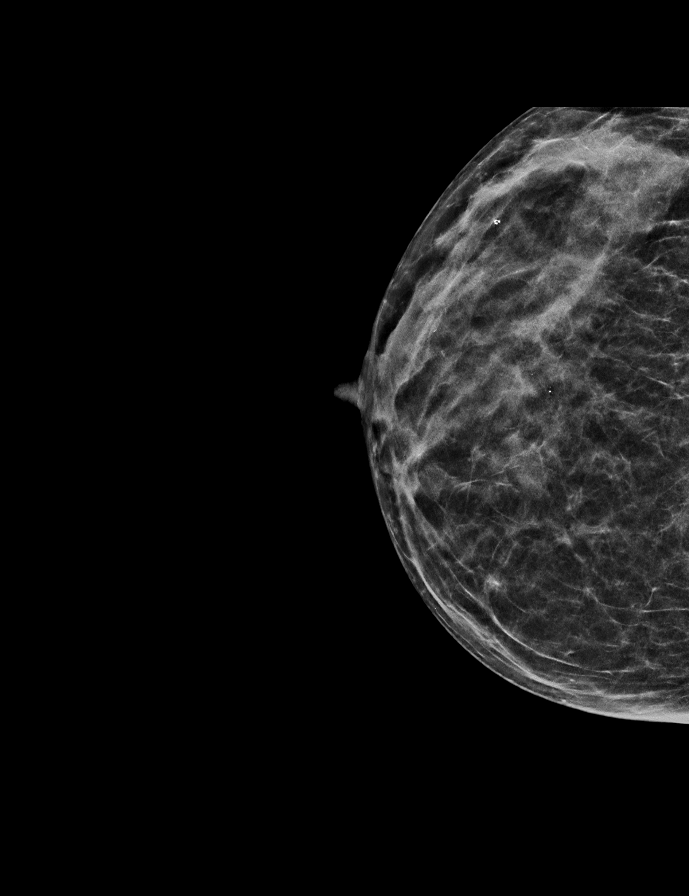

[L MLO]
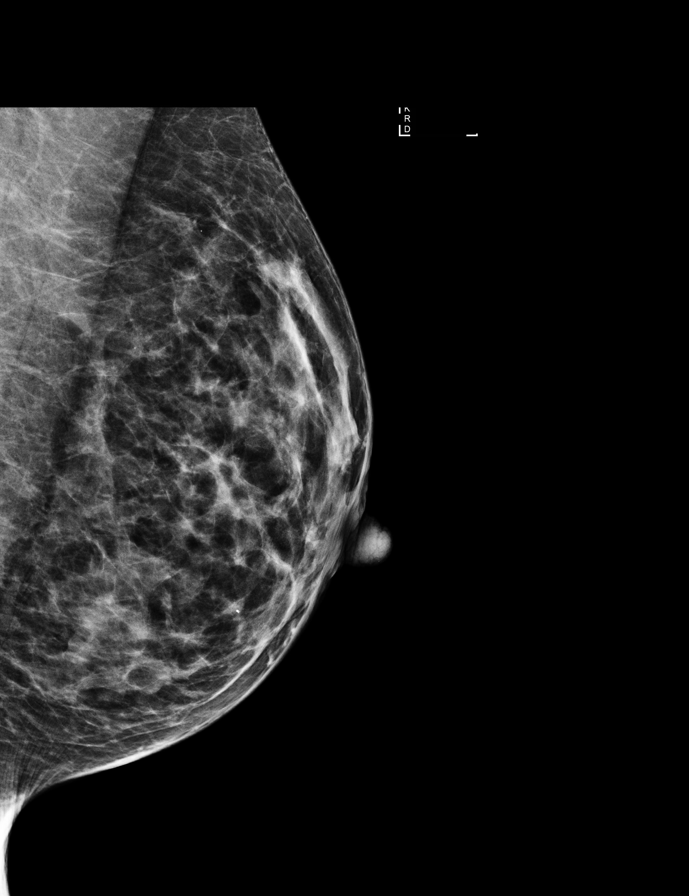

[R CC]
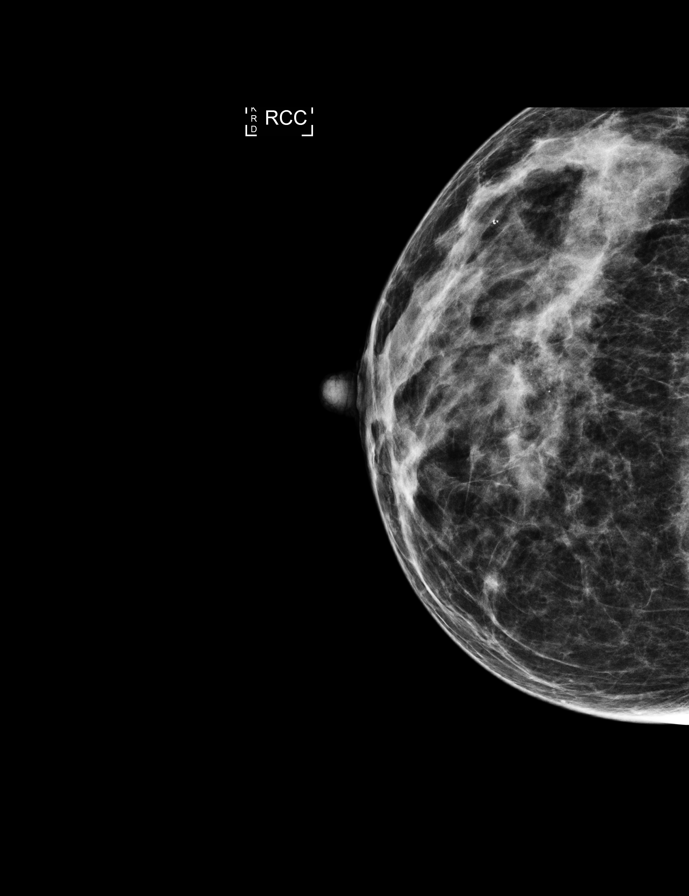

[L CC synth-2D]
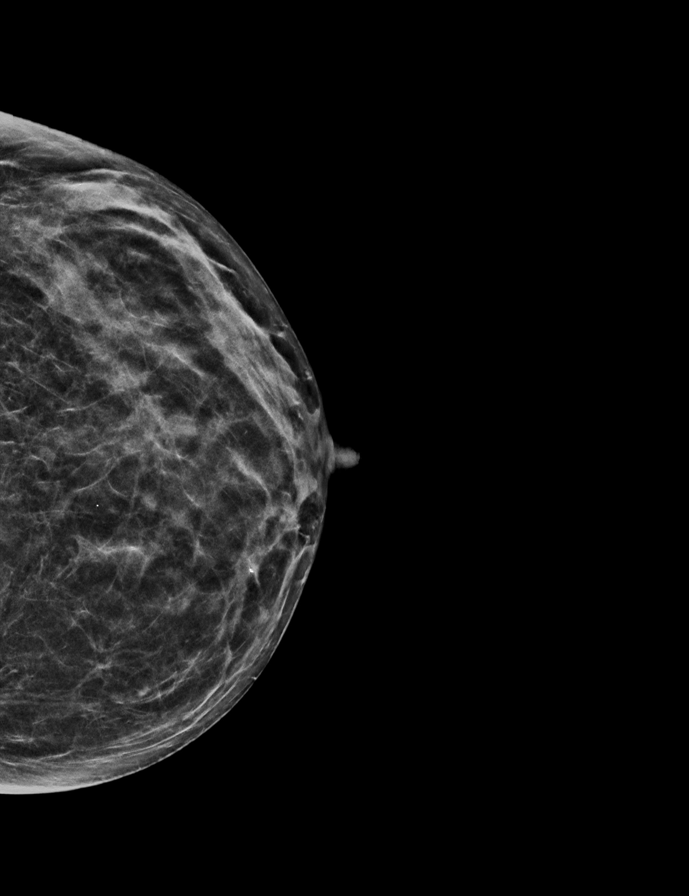

[R MLO]
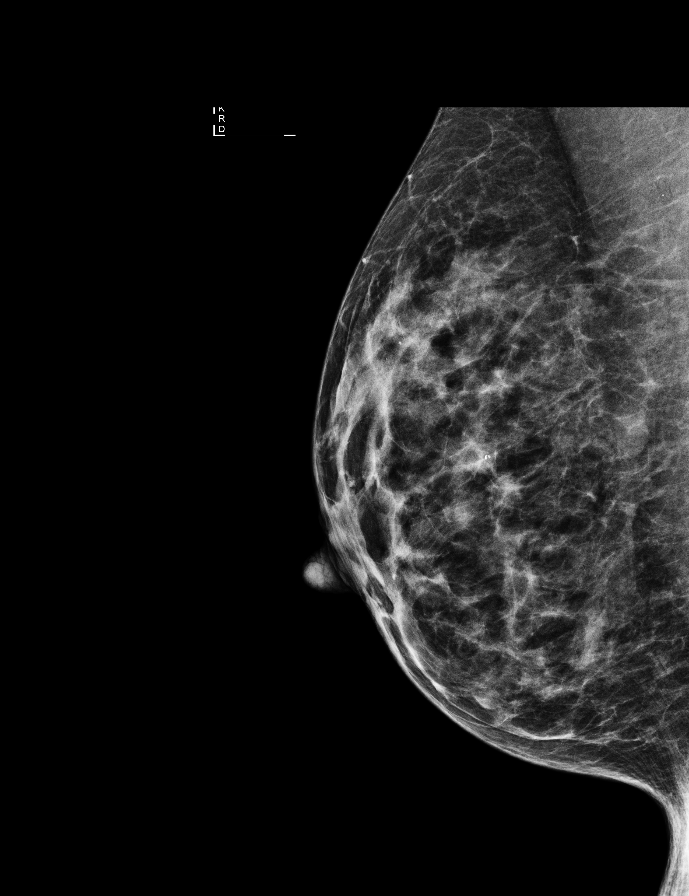

[R CC tomo · tomo slice 23/44.0]
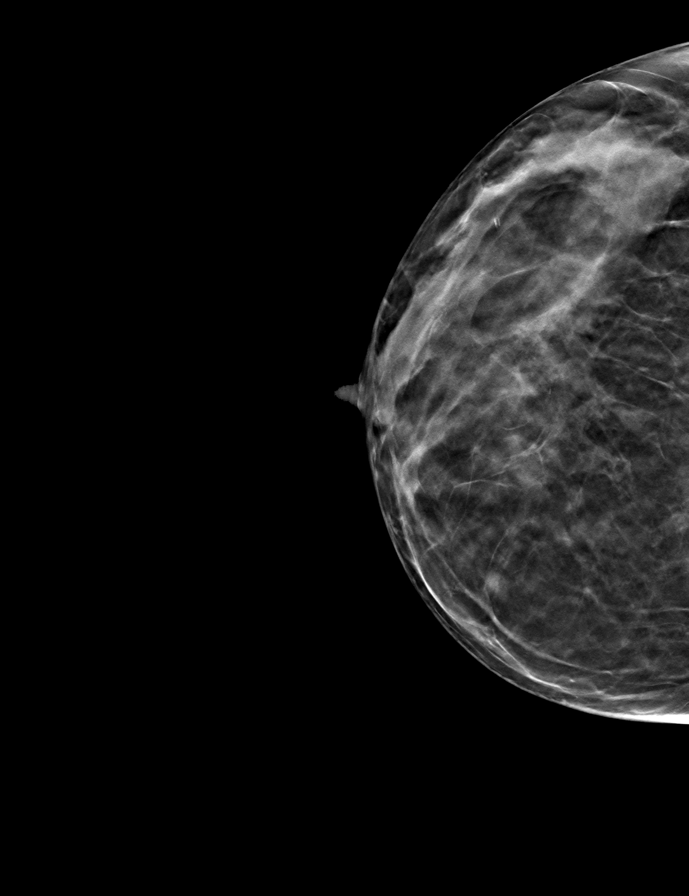

[9 of 28 positions shown; findings below may reference images not displayed]

ACR Breast Density Category c: The breast tissue is heterogeneously
dense, which may obscure small masses.
FINDINGS: In the right breast, a possible asymmetry warrants further
evaluation. In the left breast, no findings suspicious for
malignancy. Images were processed with CAD.
IMPRESSION: Further evaluation is suggested for possible asymmetry in the right
breast.

RECOMMENDATION:
Diagnostic mammogram and possibly ultrasound of the right breast.
(Code:QT-N-SSY)

The patient will be contacted regarding the findings, and additional
imaging will be scheduled.

BI-RADS CATEGORY  0: Incomplete. Need additional imaging evaluation
and/or prior mammograms for comparison.

## 2018-11-20 ENCOUNTER — Other Ambulatory Visit: Payer: Self-pay | Admitting: Family Medicine

## 2018-11-20 DIAGNOSIS — Z1231 Encounter for screening mammogram for malignant neoplasm of breast: Secondary | ICD-10-CM

## 2018-11-21 ENCOUNTER — Ambulatory Visit
Admission: RE | Admit: 2018-11-21 | Discharge: 2018-11-21 | Disposition: A | Discharge status: Home / Self Care | Payer: BLUE CROSS/BLUE SHIELD | Source: Ambulatory Visit | Attending: Family Medicine | Admitting: Family Medicine

## 2018-11-21 DIAGNOSIS — Z1231 Encounter for screening mammogram for malignant neoplasm of breast: Secondary | ICD-10-CM | POA: Insufficient documentation

## 2021-03-24 ENCOUNTER — Other Ambulatory Visit: Payer: Self-pay | Admitting: Family Medicine

## 2021-03-24 DIAGNOSIS — Z1231 Encounter for screening mammogram for malignant neoplasm of breast: Secondary | ICD-10-CM

## 2021-03-26 ENCOUNTER — Other Ambulatory Visit: Payer: Self-pay

## 2021-03-26 ENCOUNTER — Ambulatory Visit
Admission: RE | Admit: 2021-03-26 | Discharge: 2021-03-26 | Disposition: A | Discharge status: Home / Self Care | Payer: 59 | Source: Ambulatory Visit | Attending: Family Medicine | Admitting: Family Medicine

## 2021-03-26 DIAGNOSIS — Z1231 Encounter for screening mammogram for malignant neoplasm of breast: Secondary | ICD-10-CM | POA: Insufficient documentation

## 2021-09-29 ENCOUNTER — Other Ambulatory Visit: Payer: Self-pay | Admitting: Neurology

## 2021-09-29 DIAGNOSIS — R202 Paresthesia of skin: Secondary | ICD-10-CM

## 2021-10-19 ENCOUNTER — Ambulatory Visit
Admission: RE | Admit: 2021-10-19 | Discharge: 2021-10-19 | Disposition: A | Discharge status: Home / Self Care | Payer: 59 | Source: Ambulatory Visit | Attending: Neurology | Admitting: Neurology

## 2021-10-19 DIAGNOSIS — R202 Paresthesia of skin: Secondary | ICD-10-CM

## 2021-11-18 ENCOUNTER — Ambulatory Visit
Admission: RE | Admit: 2021-11-18 | Discharge: 2021-11-18 | Disposition: A | Discharge status: Home / Self Care | Payer: 59 | Source: Ambulatory Visit | Attending: Neurology | Admitting: Neurology

## 2021-11-18 MED ORDER — GADOBENATE DIMEGLUMINE 529 MG/ML IV SOLN
14.0000 mL | Freq: Once | INTRAVENOUS | Status: AC | PRN
  Administered 2021-11-18: 14 mL via INTRAVENOUS

## 2022-05-11 ENCOUNTER — Other Ambulatory Visit: Payer: Self-pay

## 2022-05-11 DIAGNOSIS — Z1231 Encounter for screening mammogram for malignant neoplasm of breast: Secondary | ICD-10-CM

## 2022-06-02 ENCOUNTER — Ambulatory Visit
Admission: RE | Admit: 2022-06-02 | Discharge: 2022-06-02 | Disposition: A | Discharge status: Home / Self Care | Payer: 59 | Source: Ambulatory Visit | Attending: Family Medicine | Admitting: Family Medicine

## 2022-06-02 DIAGNOSIS — Z1231 Encounter for screening mammogram for malignant neoplasm of breast: Secondary | ICD-10-CM

## 2022-06-03 ENCOUNTER — Encounter: Payer: Self-pay | Admitting: Family Medicine

## 2022-06-07 ENCOUNTER — Other Ambulatory Visit: Payer: Self-pay | Admitting: Student

## 2022-06-07 ENCOUNTER — Other Ambulatory Visit: Payer: Self-pay | Admitting: Family Medicine

## 2022-06-07 DIAGNOSIS — N6489 Other specified disorders of breast: Secondary | ICD-10-CM

## 2022-06-07 DIAGNOSIS — R928 Other abnormal and inconclusive findings on diagnostic imaging of breast: Secondary | ICD-10-CM

## 2022-06-15 ENCOUNTER — Ambulatory Visit
Admission: RE | Admit: 2022-06-15 | Discharge: 2022-06-15 | Disposition: A | Discharge status: Home / Self Care | Payer: 59 | Source: Ambulatory Visit | Attending: Family Medicine | Admitting: Family Medicine

## 2022-06-15 DIAGNOSIS — N6489 Other specified disorders of breast: Secondary | ICD-10-CM | POA: Diagnosis present

## 2022-06-15 DIAGNOSIS — R928 Other abnormal and inconclusive findings on diagnostic imaging of breast: Secondary | ICD-10-CM

## 2023-02-03 ENCOUNTER — Ambulatory Visit: Payer: 59 | Admitting: Anesthesiology

## 2023-02-03 ENCOUNTER — Other Ambulatory Visit: Payer: Self-pay

## 2023-02-03 ENCOUNTER — Ambulatory Visit
Admission: RE | Admit: 2023-02-03 | Discharge: 2023-02-03 | Disposition: A | Discharge status: Home / Self Care | Payer: 59 | Attending: Gastroenterology | Admitting: Gastroenterology

## 2023-02-03 ENCOUNTER — Encounter
Admission: RE | Disposition: A | Discharge status: Home / Self Care | Payer: Self-pay | Source: Home / Self Care | Attending: Gastroenterology

## 2023-02-03 DIAGNOSIS — K640 First degree hemorrhoids: Secondary | ICD-10-CM | POA: Diagnosis not present

## 2023-02-03 DIAGNOSIS — Z1211 Encounter for screening for malignant neoplasm of colon: Secondary | ICD-10-CM | POA: Insufficient documentation

## 2023-02-03 DIAGNOSIS — I10 Essential (primary) hypertension: Secondary | ICD-10-CM | POA: Insufficient documentation

## 2023-02-03 DIAGNOSIS — Z800 Family history of malignant neoplasm of digestive organs: Secondary | ICD-10-CM | POA: Diagnosis not present

## 2023-02-03 DIAGNOSIS — G629 Polyneuropathy, unspecified: Secondary | ICD-10-CM | POA: Insufficient documentation

## 2023-02-03 DIAGNOSIS — D122 Benign neoplasm of ascending colon: Secondary | ICD-10-CM | POA: Diagnosis not present

## 2023-02-03 HISTORY — PX: POLYPECTOMY: SHX5525

## 2023-02-03 HISTORY — PX: COLONOSCOPY WITH PROPOFOL: SHX5780

## 2023-02-03 SURGERY — COLONOSCOPY WITH PROPOFOL
Anesthesia: General

## 2023-02-03 MED ORDER — SODIUM CHLORIDE 0.9 % IV SOLN: INTRAVENOUS | Status: DC

## 2023-02-03 MED ORDER — FENTANYL CITRATE (PF) 100 MCG/2ML IJ SOLN
INTRAMUSCULAR | Status: AC
  Filled 2023-02-03: qty 2

## 2023-02-03 MED ORDER — PROPOFOL 10 MG/ML IV BOLUS
INTRAVENOUS | Status: DC | PRN
  Administered 2023-02-03: 70 mg via INTRAVENOUS

## 2023-02-03 MED ORDER — MIDAZOLAM HCL 2 MG/2ML IJ SOLN
INTRAMUSCULAR | Status: AC
  Filled 2023-02-03: qty 2

## 2023-02-03 MED ORDER — PROPOFOL 500 MG/50ML IV EMUL
INTRAVENOUS | Status: DC | PRN
  Administered 2023-02-03: 100 ug/kg/min via INTRAVENOUS

## 2023-02-03 MED ORDER — ONDANSETRON HCL 4 MG/2ML IJ SOLN
INTRAMUSCULAR | Status: DC | PRN
  Administered 2023-02-03: 4 mg via INTRAVENOUS

## 2023-02-03 MED ORDER — MIDAZOLAM HCL 2 MG/2ML IJ SOLN
INTRAMUSCULAR | Status: DC | PRN
  Administered 2023-02-03: 2 mg via INTRAVENOUS

## 2023-02-03 MED ORDER — FENTANYL CITRATE (PF) 100 MCG/2ML IJ SOLN
INTRAMUSCULAR | Status: DC | PRN
  Administered 2023-02-03: 100 ug via INTRAVENOUS

## 2023-02-03 NOTE — H&P (Signed)
 Outpatient short stay form Pre-procedure 02/03/2023  Ole ONEIDA Schick, MD  Primary Physician: Cyrus Selinda Moose, PA-C  Reason for visit:  Screening colonoscopy  History of present illness:    58 y/o lady with history of hypertension here for screening colonoscopy. Sister had colon cancer in her 87's. No blood thinners. History of partial hysterectomy.    Current Facility-Administered Medications:    0.9 %  sodium chloride  infusion, , Intravenous, Continuous, Onita Rush, MD, Last Rate: 20 mL/hr at 02/03/23 1124, New Bag at 02/03/23 1124  Medications Prior to Admission  Medication Sig Dispense Refill Last Dose/Taking   hydrochlorothiazide (HYDRODIURIL) 12.5 MG tablet Take 25 mg by mouth daily.   02/01/2023   docusate sodium  (COLACE) 100 MG capsule Take 1 capsule (100 mg total) by mouth 2 (two) times daily. To keep stools soft (Patient not taking: Reported on 01/19/2023) 30 capsule 0 Not Taking   gabapentin  (NEURONTIN ) 800 MG tablet Take 1 tablet (800 mg total) by mouth at bedtime. 3 tablet 0    ibuprofen  (ADVIL ,MOTRIN ) 800 MG tablet Take 1 tablet (800 mg total) by mouth every 8 (eight) hours as needed for moderate pain. 30 tablet 1    OVER THE COUNTER MEDICATION Take 4 capsules by mouth daily. Herbal life vitamins (Patient not taking: Reported on 01/19/2023)   Not Taking   oxycodone  (OXY-IR) 5 MG capsule Take 1 capsule (5 mg total) by mouth every 6 (six) hours as needed for pain. (Patient not taking: Reported on 01/19/2023) 15 capsule 0 Not Taking     No Known Allergies   Past Medical History:  Diagnosis Date   Abnormal Pap smear of cervix    AGE 45   Anemia     Review of systems:  Otherwise negative.    Physical Exam  Gen: Alert, oriented. Appears stated age.  HEENT: PERRLA. Lungs: No respiratory distress CV: RRR Abd: soft, benign, no masses Ext: No edema    Planned procedures: Proceed with colonoscopy. The patient understands the nature of the planned  procedure, indications, risks, alternatives and potential complications including but not limited to bleeding, infection, perforation, damage to internal organs and possible oversedation/side effects from anesthesia. The patient agrees and gives consent to proceed.  Please refer to procedure notes for findings, recommendations and patient disposition/instructions.     Ole ONEIDA Schick, MD [facility]

## 2023-02-03 NOTE — Interval H&P Note (Signed)
 History and Physical Interval Note:  02/03/2023 12:05 PM  Laurie Reynolds  has presented today for surgery, with the diagnosis of FH Colon Cancer.  The various methods of treatment have been discussed with the patient and family. After consideration of risks, benefits and other options for treatment, the patient has consented to  Procedure(s): COLONOSCOPY WITH PROPOFOL  (N/A) as a surgical intervention.  The patient's history has been reviewed, patient examined, no change in status, stable for surgery.  I have reviewed the patient's chart and labs.  Questions were answered to the patient's satisfaction.     Ole ONEIDA Schick  Ok to proceed with colonoscopy

## 2023-02-03 NOTE — Anesthesia Preprocedure Evaluation (Signed)
 Anesthesia Evaluation  Patient identified by MRN, date of birth, ID band Patient awake    Reviewed: Allergy & Precautions, NPO status , Patient's Chart, lab work & pertinent test results  History of Anesthesia Complications Negative for: history of anesthetic complications  Airway Mallampati: II   Neck ROM: Full    Dental  (+) Missing   Pulmonary neg pulmonary ROS   Pulmonary exam normal breath sounds clear to auscultation       Cardiovascular hypertension, Normal cardiovascular exam Rhythm:Regular Rate:Normal     Neuro/Psych  Neuromuscular disease (neuropathy)    GI/Hepatic ,GERD  ,,  Endo/Other  negative endocrine ROS    Renal/GU negative Renal ROS     Musculoskeletal   Abdominal   Peds  Hematology  (+) Blood dyscrasia, anemia   Anesthesia Other Findings   Reproductive/Obstetrics                             Anesthesia Physical Anesthesia Plan  ASA: 2  Anesthesia Plan: General   Post-op Pain Management:    Induction: Intravenous  PONV Risk Score and Plan: 3 and Propofol  infusion, TIVA and Treatment may vary due to age or medical condition  Airway Management Planned: Natural Airway  Additional Equipment:   Intra-op Plan:   Post-operative Plan:   Informed Consent: I have reviewed the patients History and Physical, chart, labs and discussed the procedure including the risks, benefits and alternatives for the proposed anesthesia with the patient or authorized representative who has indicated his/her understanding and acceptance.       Plan Discussed with: CRNA  Anesthesia Plan Comments: (LMA/GETA backup discussed.  Patient consented for risks of anesthesia including but not limited to:  - adverse reactions to medications - damage to eyes, teeth, lips or other oral mucosa - nerve damage due to positioning  - sore throat or hoarseness - damage to heart, brain, nerves,  lungs, other parts of body or loss of life  Informed patient about role of CRNA in peri- and intra-operative care.  Patient voiced understanding.)       Anesthesia Quick Evaluation

## 2023-02-03 NOTE — Anesthesia Postprocedure Evaluation (Signed)
 Anesthesia Post Note  Patient: [name]  Procedure(s) Performed: COLONOSCOPY WITH PROPOFOL  POLYPECTOMY  Patient location during evaluation: PACU Anesthesia Type: General Level of consciousness: awake and alert, oriented and patient cooperative Pain management: pain level controlled Vital Signs Assessment: post-procedure vital signs reviewed and stable Respiratory status: spontaneous breathing, nonlabored ventilation and respiratory function stable Cardiovascular status: blood pressure returned to baseline and stable Postop Assessment: adequate PO intake Anesthetic complications: no   No notable events documented.   Last Vitals:  Vitals:   02/03/23 1241 02/03/23 1251  BP: 91/65 117/77  Pulse: 72 78  Resp: (!) 21 20  Temp: (!) 36.1 C   SpO2: 96% 99%    Last Pain:  Vitals:   02/03/23 1251  TempSrc:   PainSc: 0-No pain                 Alfonso Ruths

## 2023-02-03 NOTE — Op Note (Signed)
 [facility] Patient Name: Laurie Reynolds Procedure Date: 02/03/2023 12:09 PM MRN: [id number] Account #: [id number] Date of Birth: 06/07/1965 Admit Type: Outpatient Age: 58 Room: [facility] ENDO ROOM 3 Gender: Female Note Status: Finalized Instrument Name: [id number] [id number] Procedure:             Colonoscopy Indications:           Screening in patient at increased risk: Family history                         of 1st-degree relative with colorectal cancer Providers:             Ole Schick MD, MD Referring MD:          Selinda Azalee Quan (Referring MD) Medicines:             Monitored Anesthesia Care Complications:         No immediate complications. Estimated blood loss:                         Minimal. Procedure:             Pre-Anesthesia Assessment:                        - Prior to the procedure, a History and Physical was                         performed, and patient medications and allergies were                         reviewed. The patient is competent. The risks and                         benefits of the procedure and the sedation options and                         risks were discussed with the patient. All questions                         were answered and informed consent was obtained.                         Patient identification and proposed procedure were                         verified by the physician, the nurse, the                         anesthesiologist, the anesthetist and the technician                         in the endoscopy suite. Mental Status Examination:                         alert and oriented. Airway Examination: normal                         oropharyngeal airway and neck mobility. Respiratory  Examination: clear to auscultation. CV Examination:                         normal. Prophylactic Antibiotics: The patient does not                         require prophylactic antibiotics. Prior                          Anticoagulants: The patient has taken no anticoagulant                         or antiplatelet agents. ASA Grade Assessment: II - A                         patient with mild systemic disease. After reviewing                         the risks and benefits, the patient was deemed in                         satisfactory condition to undergo the procedure. The                         anesthesia plan was to use monitored anesthesia care                         (MAC). Immediately prior to administration of                         medications, the patient was re-assessed for adequacy                         to receive sedatives. The heart rate, respiratory                         rate, oxygen saturations, blood pressure, adequacy of                         pulmonary ventilation, and response to care were                         monitored throughout the procedure. The physical                         status of the patient was re-assessed after the                         procedure.                        After obtaining informed consent, the colonoscope was                         passed under direct vision. Throughout the procedure,                         the patient's blood pressure, pulse, and oxygen  saturations were monitored continuously. The                         Colonoscope was introduced through the anus and                         advanced to the the cecum, identified by appendiceal                         orifice and ileocecal valve. The colonoscopy was                         somewhat difficult due to a tortuous colon. The                         patient tolerated the procedure well. The quality of                         the bowel preparation was good. The ileocecal valve,                         appendiceal orifice, and rectum were photographed. Findings:      The perianal and digital rectal examinations were normal.      A 10 mm  polyp was found in the ascending colon. The polyp was       mucous-capped. The polyp was removed with a cold snare. Resection and       retrieval were complete. Estimated blood loss was minimal.      Internal hemorrhoids were found during retroflexion. The hemorrhoids       were Grade I (internal hemorrhoids that do not prolapse).      The exam was otherwise without abnormality on direct and retroflexion       views. Impression:            - One 10 mm polyp in the ascending colon, removed with                         a cold snare. Resected and retrieved.                        - Internal hemorrhoids.                        - The examination was otherwise normal on direct and                         retroflexion views. Recommendation:        - Discharge patient to home.                        - Resume previous diet.                        - Continue present medications.                        - Await pathology results.                        - Repeat colonoscopy in 3 years for  surveillance.                        - Return to referring physician as previously                         scheduled. Procedure Code(s):     --- Professional ---                        [phone], Colonoscopy, flexible; with removal of                         tumor(s), polyp(s), or other lesion(s) by snare                         technique Diagnosis Code(s):     --- Professional ---                        Z80.0, Family history of malignant neoplasm of                         digestive organs                        D12.2, Benign neoplasm of ascending colon                        K64.0, First degree hemorrhoids CPT copyright 2022 [employer]. All rights reserved. The codes documented in this report are preliminary and upon coder review may  be revised to meet current compliance requirements. Ole Schick MD, MD 02/03/2023 12:38:49 PM Number of Addenda: 0 Note Initiated On: 02/03/2023 12:09 PM Scope  Withdrawal Time: 0 hours 10 minutes 30 seconds  Total Procedure Duration: 0 hours 18 minutes 54 seconds  Estimated Blood Loss:  Estimated blood loss was minimal.      [facility]

## 2023-02-03 NOTE — Transfer of Care (Signed)
 Immediate Anesthesia Transfer of Care Note  Patient: Laurie Reynolds  Procedure(s) Performed: COLONOSCOPY WITH PROPOFOL  POLYPECTOMY  Patient Location: PACU  Anesthesia Type:General  Level of Consciousness: awake, alert , and oriented  Airway & Oxygen Therapy: Patient Spontanous Breathing and Patient connected to nasal cannula oxygen  Post-op Assessment: Report given to RN, Post -op Vital signs reviewed and stable, and Patient moving all extremities  Post vital signs: Reviewed and stable  Last Vitals:  Vitals Value Taken Time  BP 91/65 02/03/23 1241  Temp 36.1 C 02/03/23 1241  Pulse 72 02/03/23 1241  Resp 21 02/03/23 1241  SpO2 96 % 02/03/23 1241    Last Pain:  Vitals:   02/03/23 1241  TempSrc: Temporal  PainSc: Asleep         Complications: No notable events documented.

## 2023-02-06 ENCOUNTER — Encounter: Payer: Self-pay | Admitting: Gastroenterology

## 2023-02-06 LAB — SURGICAL PATHOLOGY

## 2023-09-25 ENCOUNTER — Other Ambulatory Visit: Payer: Self-pay | Admitting: Family Medicine

## 2023-09-25 DIAGNOSIS — Z1231 Encounter for screening mammogram for malignant neoplasm of breast: Secondary | ICD-10-CM

## 2023-10-17 ENCOUNTER — Ambulatory Visit
Admission: RE | Admit: 2023-10-17 | Discharge: 2023-10-17 | Disposition: A | Discharge status: Home / Self Care | Source: Ambulatory Visit | Attending: Family Medicine | Admitting: Family Medicine

## 2023-10-17 DIAGNOSIS — Z1231 Encounter for screening mammogram for malignant neoplasm of breast: Secondary | ICD-10-CM | POA: Diagnosis present

## 2023-10-19 ENCOUNTER — Encounter: Payer: Self-pay | Admitting: Family Medicine

## 2023-10-20 ENCOUNTER — Other Ambulatory Visit: Payer: Self-pay | Admitting: Family Medicine

## 2023-10-20 DIAGNOSIS — R928 Other abnormal and inconclusive findings on diagnostic imaging of breast: Secondary | ICD-10-CM

## 2023-10-23 ENCOUNTER — Other Ambulatory Visit: Payer: Self-pay | Admitting: Family Medicine

## 2023-10-23 DIAGNOSIS — Z8249 Family history of ischemic heart disease and other diseases of the circulatory system: Secondary | ICD-10-CM

## 2023-10-23 DIAGNOSIS — I10 Essential (primary) hypertension: Secondary | ICD-10-CM

## 2023-10-24 ENCOUNTER — Ambulatory Visit
Admission: RE | Admit: 2023-10-24 | Discharge: 2023-10-24 | Disposition: A | Discharge status: Home / Self Care | Source: Ambulatory Visit | Attending: Family Medicine | Admitting: Family Medicine

## 2023-10-24 DIAGNOSIS — R928 Other abnormal and inconclusive findings on diagnostic imaging of breast: Secondary | ICD-10-CM | POA: Diagnosis present

## 2023-10-31 ENCOUNTER — Ambulatory Visit
Admission: RE | Admit: 2023-10-31 | Discharge: 2023-10-31 | Disposition: A | Discharge status: Home / Self Care | Payer: Self-pay | Source: Ambulatory Visit | Attending: Family Medicine | Admitting: Family Medicine

## 2023-10-31 ENCOUNTER — Other Ambulatory Visit

## 2023-10-31 DIAGNOSIS — Z8249 Family history of ischemic heart disease and other diseases of the circulatory system: Secondary | ICD-10-CM | POA: Insufficient documentation

## 2023-10-31 DIAGNOSIS — I10 Essential (primary) hypertension: Secondary | ICD-10-CM | POA: Insufficient documentation
